# Patient Record
Sex: Male | Born: 1985 | Race: Black or African American | Hispanic: No | Marital: Single | Smoking: Current every day smoker
Health system: Southern US, Community
[De-identification: ages and names within clinical notes are randomized; demographics above are authoritative.]

---

## 2017-09-19 ENCOUNTER — Encounter (HOSPITAL_BASED_OUTPATIENT_CLINIC_OR_DEPARTMENT_OTHER): Payer: Self-pay | Admitting: Adult Health

## 2017-09-19 ENCOUNTER — Emergency Department (HOSPITAL_BASED_OUTPATIENT_CLINIC_OR_DEPARTMENT_OTHER)
Admission: EM | Admit: 2017-09-19 | Discharge: 2017-09-20 | Disposition: A | Discharge status: Home / Self Care | Payer: Self-pay | Attending: Emergency Medicine | Admitting: Emergency Medicine

## 2017-09-19 ENCOUNTER — Emergency Department (HOSPITAL_BASED_OUTPATIENT_CLINIC_OR_DEPARTMENT_OTHER): Payer: Self-pay

## 2017-09-19 DIAGNOSIS — Y929 Unspecified place or not applicable: Secondary | ICD-10-CM | POA: Insufficient documentation

## 2017-09-19 DIAGNOSIS — Y249XXA Unspecified firearm discharge, undetermined intent, initial encounter: Secondary | ICD-10-CM | POA: Insufficient documentation

## 2017-09-19 DIAGNOSIS — Y9389 Activity, other specified: Secondary | ICD-10-CM | POA: Insufficient documentation

## 2017-09-19 DIAGNOSIS — F17200 Nicotine dependence, unspecified, uncomplicated: Secondary | ICD-10-CM | POA: Insufficient documentation

## 2017-09-19 DIAGNOSIS — Y998 Other external cause status: Secondary | ICD-10-CM | POA: Insufficient documentation

## 2017-09-19 DIAGNOSIS — Z23 Encounter for immunization: Secondary | ICD-10-CM | POA: Insufficient documentation

## 2017-09-19 DIAGNOSIS — S92122B Displaced fracture of body of left talus, initial encounter for open fracture: Secondary | ICD-10-CM | POA: Insufficient documentation

## 2017-09-19 MED ORDER — CEFAZOLIN SODIUM-DEXTROSE 1-4 GM/50ML-% IV SOLN
1.0000 g | Freq: Once | INTRAVENOUS | Status: AC
  Administered 2017-09-19: 1 g via INTRAVENOUS
  Filled 2017-09-19: qty 50

## 2017-09-19 MED ORDER — TETANUS-DIPHTH-ACELL PERTUSSIS 5-2.5-18.5 LF-MCG/0.5 IM SUSP
0.5000 mL | Freq: Once | INTRAMUSCULAR | Status: AC
  Administered 2017-09-19: 0.5 mL via INTRAMUSCULAR
  Filled 2017-09-19: qty 0.5

## 2017-09-19 MED ORDER — MORPHINE SULFATE (PF) 4 MG/ML IV SOLN
4.0000 mg | INTRAVENOUS | Status: DC | PRN
  Administered 2017-09-19: 4 mg via INTRAVENOUS
  Filled 2017-09-19: qty 1

## 2017-09-19 MED ORDER — ONDANSETRON HCL 4 MG/2ML IJ SOLN
4.0000 mg | Freq: Once | INTRAMUSCULAR | Status: AC
  Administered 2017-09-19: 4 mg via INTRAVENOUS
  Filled 2017-09-19: qty 2

## 2017-09-19 MED ORDER — CIPROFLOXACIN IN D5W 400 MG/200ML IV SOLN
400.0000 mg | Freq: Once | INTRAVENOUS | Status: AC
  Administered 2017-09-19: 400 mg via INTRAVENOUS
  Filled 2017-09-19: qty 200
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
## 2017-09-20 MED ORDER — CIPROFLOXACIN HCL 500 MG PO TABS: 500.0000 mg | ORAL_TABLET | Freq: Two times a day (BID) | ORAL | 0 refills | Status: AC

## 2017-09-20 MED ORDER — HYDROCODONE-ACETAMINOPHEN 5-325 MG PO TABS: 1.0000 | ORAL_TABLET | ORAL | 0 refills | Status: AC | PRN

## 2017-09-20 NOTE — Discharge Instructions (Signed)
Strict non weight bearing until seen by orthopedic doctor. Cipro antibiotic. Elevate whenever possible.
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
## 2023-04-26 ENCOUNTER — Emergency Department (HOSPITAL_BASED_OUTPATIENT_CLINIC_OR_DEPARTMENT_OTHER): Admission: EM | Admit: 2023-04-26 | Discharge: 2023-04-26 | Discharge status: Against Medical Advice | Payer: Self-pay
